# Patient Record
Sex: Female | Born: 1985 | Race: White | Hispanic: No | Marital: Single | Smoking: Never smoker
Health system: Southern US, Community
[De-identification: ages and names within clinical notes are randomized; demographics above are authoritative.]

## PROBLEM LIST (undated history)

## (undated) DIAGNOSIS — F419 Anxiety disorder, unspecified: Secondary | ICD-10-CM

## (undated) HISTORY — PX: NO PAST SURGERIES: SHX2092

---

## 2007-09-10 ENCOUNTER — Ambulatory Visit: Payer: Self-pay | Admitting: Internal Medicine

## 2010-05-17 ENCOUNTER — Emergency Department: Payer: Self-pay

## 2014-01-06 ENCOUNTER — Emergency Department: Payer: Self-pay | Admitting: Emergency Medicine

## 2017-10-23 ENCOUNTER — Encounter: Payer: Self-pay | Admitting: Emergency Medicine

## 2017-10-23 ENCOUNTER — Emergency Department
Admission: EM | Admit: 2017-10-23 | Discharge: 2017-10-23 | Disposition: A | Discharge status: Home / Self Care | Payer: Self-pay | Attending: Emergency Medicine | Admitting: Emergency Medicine

## 2017-10-23 ENCOUNTER — Other Ambulatory Visit: Payer: Self-pay

## 2017-10-23 ENCOUNTER — Emergency Department: Payer: Self-pay

## 2017-10-23 DIAGNOSIS — M5442 Lumbago with sciatica, left side: Secondary | ICD-10-CM | POA: Insufficient documentation

## 2017-10-23 LAB — COMPREHENSIVE METABOLIC PANEL
ALBUMIN: 4 g/dL (ref 3.5–5.0)
ALK PHOS: 52 U/L (ref 38–126)
ALT: 10 U/L — ABNORMAL LOW (ref 14–54)
ANION GAP: 11 (ref 5–15)
AST: 19 U/L (ref 15–41)
BUN: 15 mg/dL (ref 6–20)
CALCIUM: 9 mg/dL (ref 8.9–10.3)
CO2: 23 mmol/L (ref 22–32)
Chloride: 105 mmol/L (ref 101–111)
Creatinine, Ser: 0.69 mg/dL (ref 0.44–1.00)
GFR calc Af Amer: >60 mL/min (ref >60)
GFR calc non Af Amer: >60 mL/min (ref >60)
GLUCOSE: 102 mg/dL — AB (ref 65–99)
Potassium: 3.5 mmol/L (ref 3.5–5.1)
SODIUM: 139 mmol/L (ref 135–145)
Total Bilirubin: 0.4 mg/dL (ref 0.3–1.2)
Total Protein: 7.6 g/dL (ref 6.5–8.1)

## 2017-10-23 LAB — DIFFERENTIAL
BASOS ABS: 0 10*3/uL (ref 0–0.1)
Basophils Relative: 0 %
EOS ABS: 0.1 10*3/uL (ref 0–0.7)
Eosinophils Relative: 2 %
LYMPHS ABS: 1.6 10*3/uL (ref 1.0–3.6)
LYMPHS PCT: 28 %
Monocytes Absolute: 0.4 10*3/uL (ref 0.2–0.9)
Monocytes Relative: 8 %
NEUTROS PCT: 62 %
Neutro Abs: 3.5 10*3/uL (ref 1.4–6.5)

## 2017-10-23 LAB — CBC
HCT: 38.6 % (ref 35.0–47.0)
HEMOGLOBIN: 13 g/dL (ref 12.0–16.0)
MCH: 32 pg (ref 26.0–34.0)
MCHC: 33.7 g/dL (ref 32.0–36.0)
MCV: 94.7 fL (ref 80.0–100.0)
Platelets: 367 10*3/uL (ref 150–440)
RBC: 4.08 MIL/uL (ref 3.80–5.20)
RDW: 16.1 % — ABNORMAL HIGH (ref 11.5–14.5)
WBC: 5.6 10*3/uL (ref 3.6–11.0)

## 2017-10-23 LAB — TROPONIN I: Troponin I: <0.03 ng/mL (ref <0.03)

## 2017-10-23 LAB — POCT PREGNANCY, URINE: Preg Test, Ur: NEGATIVE

## 2017-10-23 MED ORDER — PREDNISONE 20 MG PO TABS: 40.0000 mg | ORAL_TABLET | Freq: Every day | ORAL | 0 refills | Status: DC

## 2017-10-23 MED ORDER — KETOROLAC TROMETHAMINE 10 MG PO TABS: 10.0000 mg | ORAL_TABLET | Freq: Four times a day (QID) | ORAL | 0 refills | Status: DC | PRN

## 2017-10-23 NOTE — ED Notes (Signed)
Pt c/o bilateraly leg pain with weakness, states unable to ambulate due to weakness to legs. Pt in NAD at this time, states pain with movement. Denies injures, no deformity or swelling noted.

## 2017-10-23 NOTE — ED Triage Notes (Signed)
Pt presents with legs that are "seizing up" today. States she has cramping in her toes with numbness. Pt has had nerve pain from her neck for 2 weeks and today she is having difficulty with walking due to the pain. Pt alert & oriented with NAD noted.

## 2017-10-23 NOTE — ED Provider Notes (Signed)
[facility] Emergency Department Provider Note  Time seen: 4:56 PM  I have reviewed the triage vital signs and the nursing notes.   HISTORY  Chief Complaint Leg Pain    HPI Misty Rowe is a 31 y.o. female with no significant past medical history who presents to the emergency department for lower back pain and left leg pain.  According to the patient approximately 2 weeks ago at her job she was attempting to move heavy objects and she developed lower back pain.  He states over the past 2 weeks the back pain has progressively worsened to the point where she has pain just from standing upright at work.  She also states over the past 1 week she is now experiencing pain radiating all the way down her left leg and occasional tingling/numbness sensations throughout the left leg.  States pain is much worse with any movement of the leg or lower back.  Denies any fever.  No edema.  Patient denies any incontinence of bowel or bladder.  History reviewed. No pertinent past medical history.  There are no active problems to display for this patient.   History reviewed. No pertinent surgical history.  Prior to Admission medications   Not on File    No Known Allergies  No family history on file.  Social History Social History   Tobacco Use  . Smoking status: Never Smoker  . Smokeless tobacco: Never Used  Substance Use Topics  . Alcohol use: No    Frequency: Never  . Drug use: No    Review of Systems Constitutional: Negative for fever. Cardiovascular: Negative for chest pain. Respiratory: Negative for shortness of breath. Gastrointestinal: Negative for abdominal pain Musculoskeletal: Left leg pain.  Lower back pain Skin: Negative for rash. All other ROS negative  ____________________________________________   PHYSICAL EXAM:  VITAL SIGNS: ED Triage Vitals  Enc Vitals Group     BP 10/23/17 1557 116/67     Pulse Rate 10/23/17 1557 95     Resp  10/23/17 1557 20     Temp 10/23/17 1557 98.4 F (36.9 C)     Temp Source 10/23/17 1557 Oral     SpO2 10/23/17 1557 100 %     Weight 10/23/17 1558 30 lb (13.6 kg)     Height 10/23/17 1558 5\' 3"  (1.6 m)     Head Circumference --      Peak Flow --      Pain Score 10/23/17 1608 9     Pain Loc --      Pain Edu? --      Excl. in GC? --    Constitutional: Alert and oriented. Well appearing and in no distress. Eyes: Normal exam ENT   Head: Normocephalic and atraumatic.   Mouth/Throat: Mucous membranes are moist. Cardiovascular: Normal rate, regular rhythm. No murmur Respiratory: Normal respiratory effort without tachypnea nor retractions. Breath sounds are clear Gastrointestinal: Soft and nontender. No distention.   Musculoskeletal: Nontender with normal range of motion in all extremities.  No lower extremity edema.  No significant tenderness to palpation.  The patient points out numbness/tingling in her distal thigh Neurologic:  Normal speech and language.  Patient has 5/5 strength in bilateral lower extremities.  Patient states intact and equal sensation below the knee, states mild sensory deficit in the left lower extremity to the distal thigh but then states normal sensation to the proximal thigh bilaterally. Skin:  Skin is warm, dry and intact.  Psychiatric: Mood and affect are  normal.   ____________________________________________     RADIOLOGY  MRI is normal  ____________________________________________   INITIAL IMPRESSION / ASSESSMENT AND PLAN / ED COURSE  Pertinent labs & imaging results that were available during my care of the patient were reviewed by me and considered in my medical decision making (see chart for details).  Patient presented to the emergency department for worsening lower back pain now radiating to the left leg and occasional numbness/tingling sensations in the left leg.  Differential would include musculoskeletal pain, radiculopathy/sciatica,  less likely cauda equina.  Given the progressive worsening I discussed the options with the patient including MRI of the lower back to rule out spinal/nerve root compression.  Patient wishes to proceed with MRI of the lower back.  On physical exam patient has great strength, sensation is largely intact besides the distal thigh of the left lower extremity which she says numbness/tingling sensations.  Patient states at times she feels like the left leg is weak and she has difficulty standing on it.  I believe an MRI would be warranted to rule out significant nerve root compression or cauda equina syndrome.  No edema or significant length tenderness identified, no skin color changes, no clinical concern for DVT, symptoms seem much more suggestive of sciatica/radiculopathy.  ----------------------------------------- 8:09 PM on 10/23/2017 ----------------------------------------- We continue to await MRI, they state the patient is next but they are currently finishing up a case.  Patient's MRI has resulted and is normal.  No canal stenosis or significant abnormality identified.  We will place the patient on a course of prednisone as well as Toradol for pain control and have the patient follow-up with her primary care doctor. ____________________________________________   FINAL CLINICAL IMPRESSION(S) / ED DIAGNOSES  Lower back pain Sciatica    Minna AntisPaduchowski, Haydn Cush, MD 10/23/17 2124

## 2017-10-28 ENCOUNTER — Other Ambulatory Visit: Payer: Self-pay

## 2017-10-28 ENCOUNTER — Encounter: Payer: Self-pay | Admitting: Emergency Medicine

## 2017-10-28 ENCOUNTER — Emergency Department
Admission: EM | Admit: 2017-10-28 | Discharge: 2017-10-28 | Disposition: A | Discharge status: Home / Self Care | Payer: Self-pay | Attending: Emergency Medicine | Admitting: Emergency Medicine

## 2017-10-28 ENCOUNTER — Emergency Department: Payer: Self-pay

## 2017-10-28 DIAGNOSIS — R4781 Slurred speech: Secondary | ICD-10-CM | POA: Insufficient documentation

## 2017-10-28 DIAGNOSIS — R29810 Facial weakness: Secondary | ICD-10-CM | POA: Insufficient documentation

## 2017-10-28 DIAGNOSIS — R2681 Unsteadiness on feet: Secondary | ICD-10-CM | POA: Insufficient documentation

## 2017-10-28 DIAGNOSIS — M545 Low back pain: Secondary | ICD-10-CM | POA: Insufficient documentation

## 2017-10-28 LAB — CBC
HEMATOCRIT: 38.4 % (ref 35.0–47.0)
HEMOGLOBIN: 13 g/dL (ref 12.0–16.0)
MCH: 31.9 pg (ref 26.0–34.0)
MCHC: 33.8 g/dL (ref 32.0–36.0)
MCV: 94.4 fL (ref 80.0–100.0)
Platelets: 343 10*3/uL (ref 150–440)
RBC: 4.07 MIL/uL (ref 3.80–5.20)
RDW: 15.9 % — ABNORMAL HIGH (ref 11.5–14.5)
WBC: 10.7 10*3/uL (ref 3.6–11.0)

## 2017-10-28 LAB — COMPREHENSIVE METABOLIC PANEL
ALT: 12 U/L — AB (ref 14–54)
AST: 14 U/L — ABNORMAL LOW (ref 15–41)
Albumin: 4.2 g/dL (ref 3.5–5.0)
Alkaline Phosphatase: 50 U/L (ref 38–126)
Anion gap: 10 (ref 5–15)
BILIRUBIN TOTAL: 0.2 mg/dL — AB (ref 0.3–1.2)
BUN: 18 mg/dL (ref 6–20)
CO2: 24 mmol/L (ref 22–32)
CREATININE: 0.53 mg/dL (ref 0.44–1.00)
Calcium: 9.1 mg/dL (ref 8.9–10.3)
Chloride: 106 mmol/L (ref 101–111)
GFR calc Af Amer: >60 mL/min (ref >60)
Glucose, Bld: 91 mg/dL (ref 65–99)
Potassium: 3.4 mmol/L — ABNORMAL LOW (ref 3.5–5.1)
Sodium: 140 mmol/L (ref 135–145)
TOTAL PROTEIN: 7.7 g/dL (ref 6.5–8.1)

## 2017-10-28 LAB — DIFFERENTIAL
BASOS ABS: 0.1 10*3/uL (ref 0–0.1)
Basophils Relative: 1 %
EOS ABS: 0.1 10*3/uL (ref 0–0.7)
Eosinophils Relative: 1 %
LYMPHS ABS: 3.2 10*3/uL (ref 1.0–3.6)
Lymphocytes Relative: 30 %
MONOS PCT: 12 %
Monocytes Absolute: 1.2 10*3/uL — ABNORMAL HIGH (ref 0.2–0.9)
NEUTROS ABS: 6.2 10*3/uL (ref 1.4–6.5)
Neutrophils Relative %: 58 %

## 2017-10-28 LAB — TROPONIN I

## 2017-10-28 MED ORDER — GADOBENATE DIMEGLUMINE 529 MG/ML IV SOLN
12.0000 mL | Freq: Once | INTRAVENOUS | Status: AC | PRN
  Administered 2017-10-28: 12 mL via INTRAVENOUS

## 2017-10-28 NOTE — ED Notes (Signed)
Pt to mri 

## 2017-10-28 NOTE — ED Notes (Signed)
Pt back from mri

## 2017-10-28 NOTE — ED Notes (Signed)
Mri spoke with pt - pt changed into gown and all jewelry and metal removed. Pt given blanket.

## 2017-10-28 NOTE — ED Notes (Signed)
Pt still in mri 

## 2017-10-28 NOTE — ED Triage Notes (Signed)
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 She felt a pop in her back was unable to walk. She came here and was given three days off, today she was at work and and co workers told her that she was studdering and slurring her words. She reports that she has to work 11 hours tomorrow and she feels that she is having trouble walking and wont be able to do this. She states that she feels like she looks like she is drunk and customers are looking at her like she is drunk. She did bring workers comp papers again.

## 2017-10-28 NOTE — Discharge Instructions (Signed)
Please seek medical attention for any high fevers, chest pain, shortness of breath, change in behavior, persistent vomiting, bloody stool or any other new or concerning symptoms.  

## 2017-10-28 NOTE — ED Provider Notes (Signed)
[facility] Emergency Department Provider Note  ____________________________________________   I have reviewed the triage vital signs and the nursing notes.   HISTORY  Chief Complaint Gait Problem   History limited by: Not Limited   HPI Misty Rowe is a 31 y.o. female who presents to the emergency department today because of concern for concern for stroke with neurologic deficits.    LOCATION:facial asymetry, gait instability, left upper extremity weakness, slurred speech DURATION:gait instability for a couple of weeks, others symptoms started today TIMING: constant SEVERITY: severe QUALITY: weakness, slurred speech, inbalance CONTEXT: patient was seen in the emergency department roughly 5 days ago for gait issues after a fall. Patient had lumbar spine mri done at that time without any concerning findings.  MODIFYING FACTORS: none ASSOCIATED SYMPTOMS: denies headache. Denies head trauma  Per medical record review patient has a history of recent er visit with lumbar spine mri.   History reviewed. No pertinent past medical history.  There are no active problems to display for this patient.   History reviewed. No pertinent surgical history.  Prior to Admission medications   Medication Sig Start Date End Date Taking? Authorizing Provider  ketorolac (TORADOL) 10 MG tablet Take 1 tablet (10 mg total) every 6 (six) hours as needed by mouth. 10/23/17   Harvest Dark, MD  predniSONE (DELTASONE) 20 MG tablet Take 2 tablets (40 mg total) daily by mouth. 10/23/17   Harvest Dark, MD    Allergies Pertussis vaccines  No family history on file.  Social History Social History   Tobacco Use  . Smoking status: Never Smoker  . Smokeless tobacco: Never Used  Substance Use Topics  . Alcohol use: No    Frequency: Never  . Drug use: No    Review of Systems Constitutional: No fever/chills Eyes: No visual changes. ENT: No sore  throat. Cardiovascular: Denies chest pain. Respiratory: Denies shortness of breath. Gastrointestinal: No abdominal pain.  No nausea, no vomiting.  No diarrhea.   Genitourinary: Negative for dysuria. Musculoskeletal: Positive for low back pain. Skin: Negative for rash. Neurological: Positive for slurred speech, positive for facial droop, positive for imbalance.   ____________________________________________   PHYSICAL EXAM:  VITAL SIGNS: ED Triage Vitals  Enc Vitals Group     BP 10/28/17 1452 114/60     Pulse Rate 10/28/17 1452 87     Resp 10/28/17 1452 18     Temp 10/28/17 1452 98.1 F (36.7 C)     Temp Source 10/28/17 1452 Oral     SpO2 10/28/17 1452 99 %     Weight 10/28/17 1454 132 lb (59.9 kg)     Height 10/28/17 1454 5' 3" (1.6 m)     Head Circumference --      Peak Flow --      Pain Score 10/28/17 1451 8    Constitutional: Alert and oriented. Well appearing and in no distress. Eyes: Conjunctivae are normal.  ENT   Head: Normocephalic and atraumatic.   Nose: No congestion/rhinnorhea.   Mouth/Throat: Mucous membranes are moist.   Neck: No stridor. Hematological/Lymphatic/Immunilogical: No cervical lymphadenopathy. Cardiovascular: Normal rate, regular rhythm.  No murmurs, rubs, or gallops.  Respiratory: Normal respiratory effort without tachypnea nor retractions. Breath sounds are clear and equal bilaterally. No wheezes/rales/rhonchi. Gastrointestinal: Soft and non tender. No rebound. No guarding.  Genitourinary: Deferred Musculoskeletal: Normal range of motion in all extremities. No lower extremity edema. Neurologic:  Normal speech and language. No gross focal neurologic deficits are appreciated.  Skin:  Skin is warm, dry and intact. No rash noted. Psychiatric: Mood and affect are normal. Speech and behavior are normal. Patient exhibits appropriate insight and judgment.  ____________________________________________    LABS (pertinent  positives/negatives)  Cbc wnl except rdw CMP k 3.4 Trop <0.03  ____________________________________________   EKG  None  ____________________________________________    RADIOLOGY  CT head No acute process  MRI Brain Normal brain ____________________________________________   PROCEDURES  Procedures  ____________________________________________   INITIAL IMPRESSION / ASSESSMENT AND PLAN / ED COURSE  Pertinent labs & imaging results that were available during my care of the patient were reviewed by me and considered in my medical decision making (see chart for details).  Patient presented to the emergency department today with multiple neurologic complaints. There was history of fall with subsequent back pain and gait instability with ED evaluation and negative lumbar MRI. Today however symptoms now include slurred speech and facial droop. While neither were appreciated by myself on exam family did endorse those symptoms. Given myriad neurologic complaints including facial complaints MR brain was obtained to evaluate for CVA/MS or other intracranial process. This was negative. Blood work additionally without any obvious etiology of the patient's symptoms. At this time do feel it is safe for patient to be discharged from the emergency department. Will give patient follow up information for neurology. Discussed results and plan with patient and family.  ____________________________________________   FINAL CLINICAL IMPRESSION(S) / ED DIAGNOSES  Final diagnoses:  Gait instability  Slurred speech  Facial droop  Low back pain, unspecified back pain laterality, unspecified chronicity, with sciatica presence unspecified     Note: This dictation was prepared with Dragon dictation. Any transcriptional errors that result from this process are unintentional     Nance Pear, MD 10/28/17 2238

## 2017-11-20 ENCOUNTER — Other Ambulatory Visit: Payer: Self-pay | Admitting: Neurology

## 2017-11-20 DIAGNOSIS — G629 Polyneuropathy, unspecified: Secondary | ICD-10-CM

## 2017-11-20 DIAGNOSIS — R531 Weakness: Secondary | ICD-10-CM

## 2017-11-20 DIAGNOSIS — R270 Ataxia, unspecified: Secondary | ICD-10-CM

## 2017-11-30 ENCOUNTER — Ambulatory Visit
Admission: RE | Admit: 2017-11-30 | Discharge: 2017-11-30 | Disposition: A | Discharge status: Home / Self Care | Payer: No Typology Code available for payment source | Source: Ambulatory Visit | Attending: Neurology | Admitting: Neurology

## 2017-11-30 ENCOUNTER — Other Ambulatory Visit: Payer: Self-pay | Admitting: Neurology

## 2017-11-30 DIAGNOSIS — R531 Weakness: Secondary | ICD-10-CM

## 2017-11-30 DIAGNOSIS — R270 Ataxia, unspecified: Secondary | ICD-10-CM | POA: Diagnosis not present

## 2017-11-30 DIAGNOSIS — G629 Polyneuropathy, unspecified: Secondary | ICD-10-CM | POA: Diagnosis not present

## 2017-11-30 DIAGNOSIS — M4802 Spinal stenosis, cervical region: Secondary | ICD-10-CM | POA: Diagnosis not present

## 2017-11-30 DIAGNOSIS — M50223 Other cervical disc displacement at C6-C7 level: Secondary | ICD-10-CM | POA: Diagnosis not present

## 2018-01-29 ENCOUNTER — Ambulatory Visit
Admission: RE | Admit: 2018-01-29 | Discharge: 2018-01-29 | Disposition: A | Discharge status: Home / Self Care | Payer: Worker's Compensation | Source: Ambulatory Visit | Attending: Physician Assistant | Admitting: Physician Assistant

## 2018-01-29 ENCOUNTER — Other Ambulatory Visit: Payer: Self-pay | Admitting: Physician Assistant

## 2018-01-29 DIAGNOSIS — R9389 Abnormal findings on diagnostic imaging of other specified body structures: Secondary | ICD-10-CM | POA: Insufficient documentation

## 2018-01-29 DIAGNOSIS — M546 Pain in thoracic spine: Secondary | ICD-10-CM | POA: Insufficient documentation

## 2018-01-29 DIAGNOSIS — M549 Dorsalgia, unspecified: Secondary | ICD-10-CM

## 2018-04-19 IMAGING — MR MR CERVICAL SPINE W/O CM
5 series · 35 of 48 positions shown · non-contrast
Comparison: None.

CLINICAL DATA: Low back pain and weakness. Tension across the
scapulae. Ataxia, imbalance, and multiple falls. Symptoms for 2
months.

EXAM:
MRI CERVICAL SPINE WITHOUT CONTRAST
TECHNIQUE: Multiplanar, multisequence MR imaging of the cervical spine was
performed. No intravenous contrast was administered.

[Series 3: T2 · sagittal · 3.0mm · 0.70mm/px · 8 of 13 slices shown (1 of 2)]
[im 1/13]
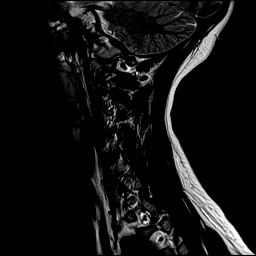
[im 2/13]
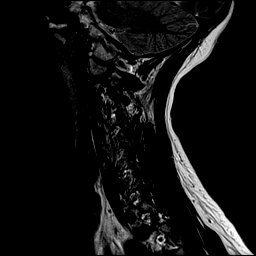
[im 4/13]
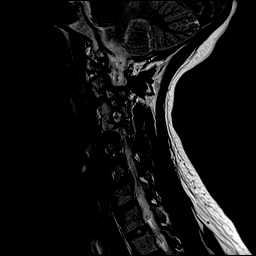
[im 6/13]
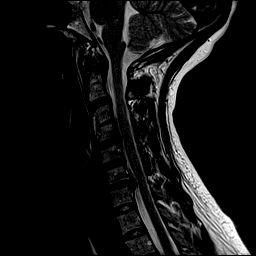
[im 7/13]
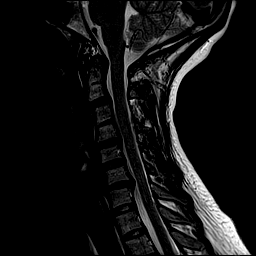
[im 9/13]
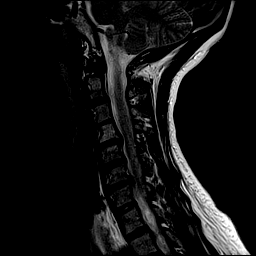
[im 11/13]
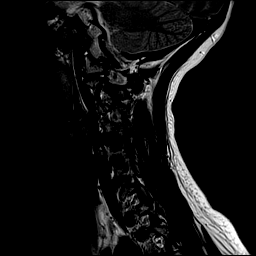
[im 13/13]
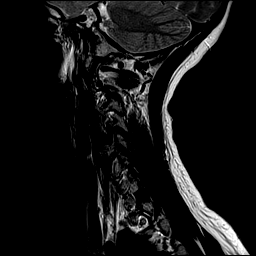

[Series 4: T1 · sagittal · 3.0mm · 0.70mm/px · 7 of 13 slices shown]
[im 1/13]
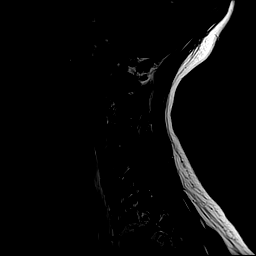
[im 3/13]
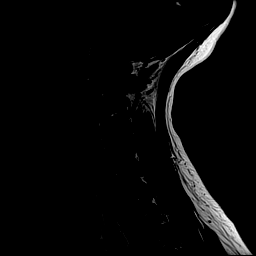
[im 5/13]
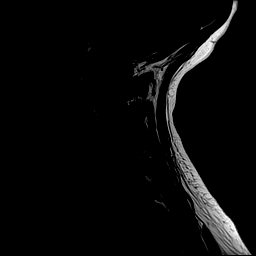
[im 7/13]
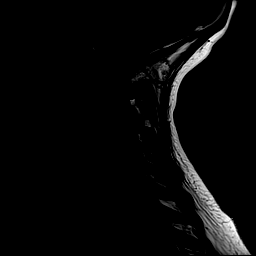
[im 9/13]
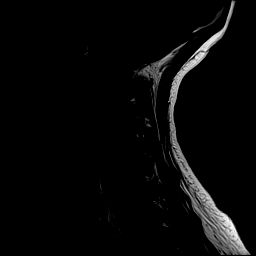
[im 11/13]
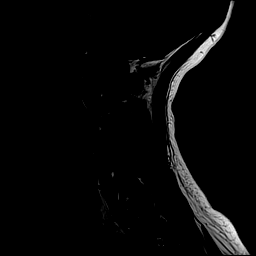
[im 13/13]
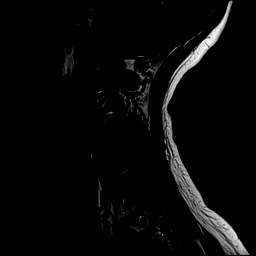

[Series 5: STIR · sagittal · 3.0mm · 0.35mm/px · 7 of 13 slices shown]
[im 1/13]
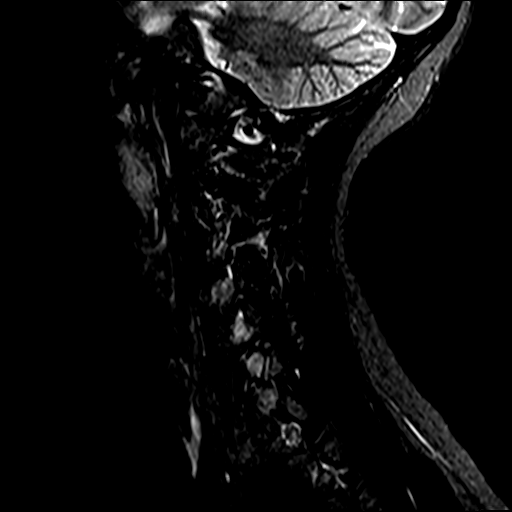
[im 3/13]
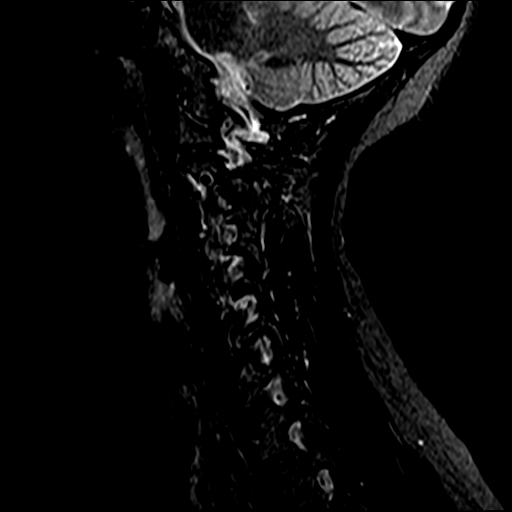
[im 5/13]
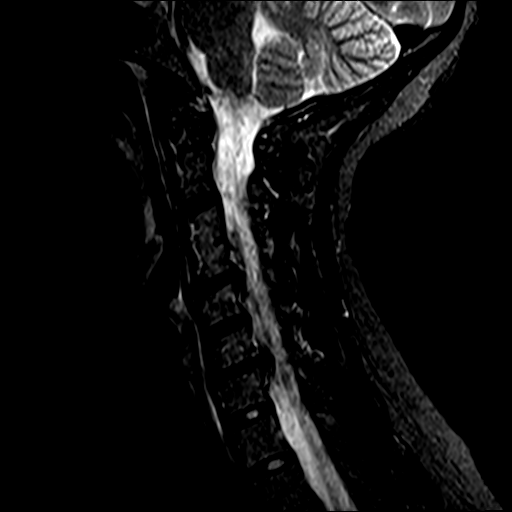
[im 7/13]
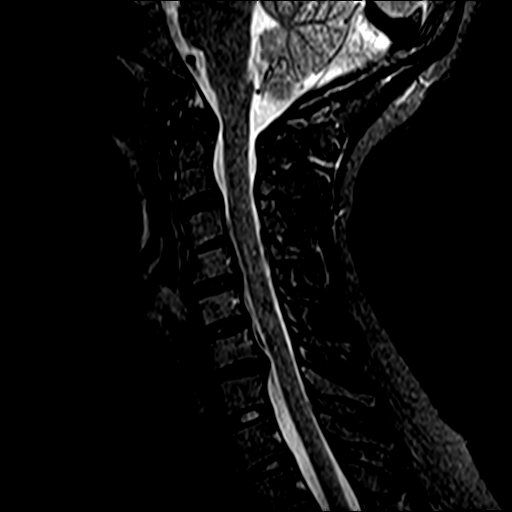
[im 9/13]
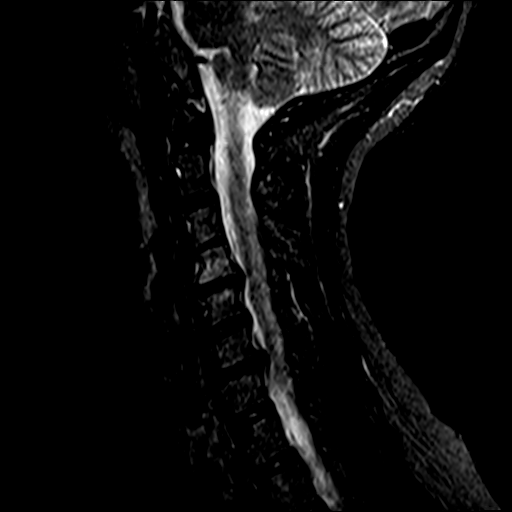
[im 11/13]
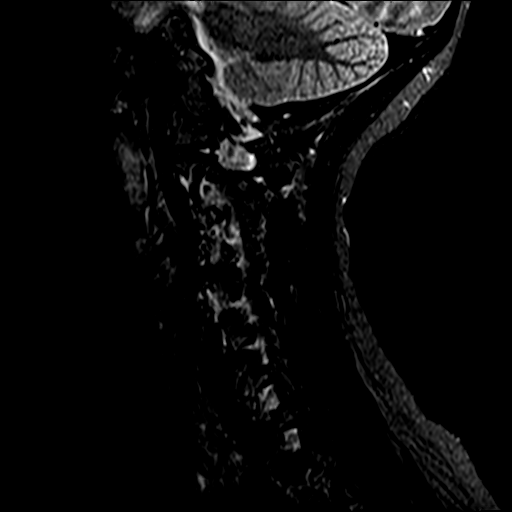
[im 13/13]
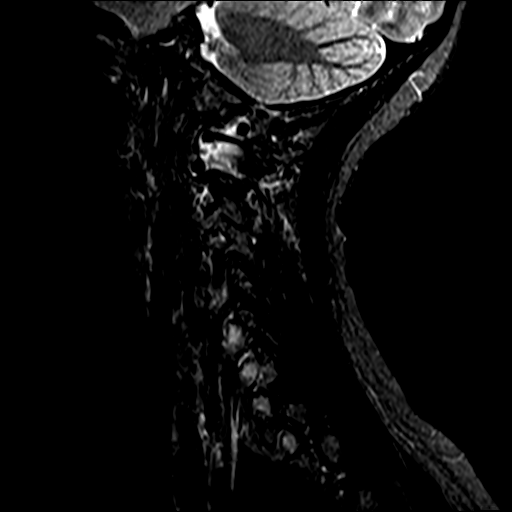

[Series 6: T2 · axial · 3.0mm · 0.70mm/px · z∈[-94,-3]mm · 9 of 25 slices shown (2 of 2)]
[im 1/25]
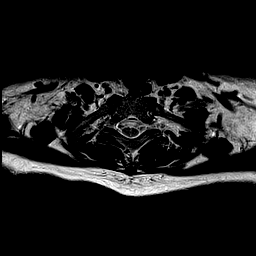
[im 5/25]
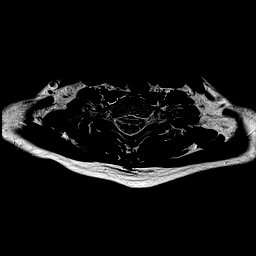
[im 9/25]
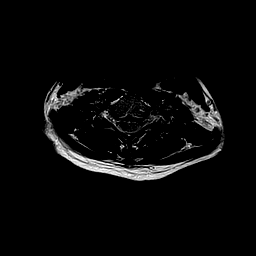
[im 11/25]
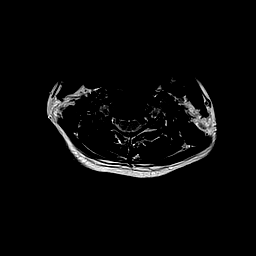
[im 13/25]
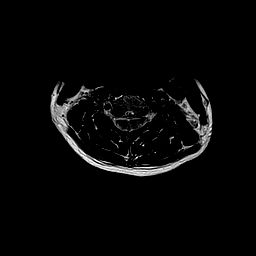
[im 15/25]
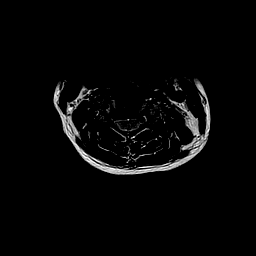
[im 17/25]
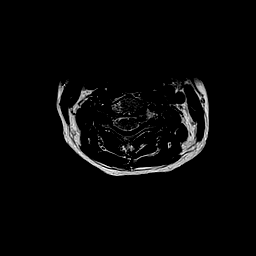
[im 21/25]
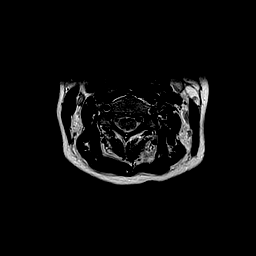
[im 25/25]
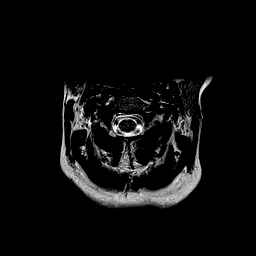

[Series 7: mpgr ax · axial · 3.0mm · 0.35mm/px · z∈[-94,-56]mm · 4 of 25 slices shown]
[im 1/25]
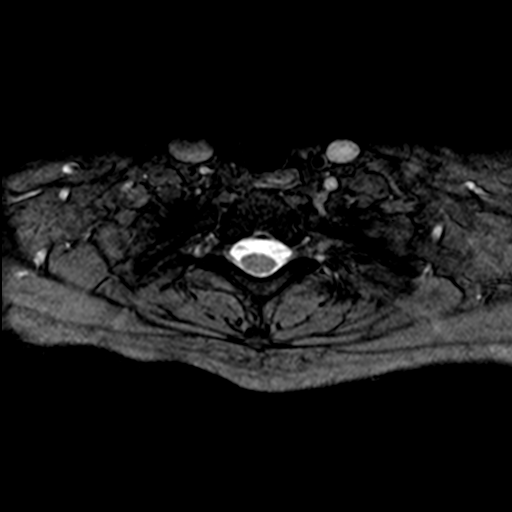
[im 5/25]
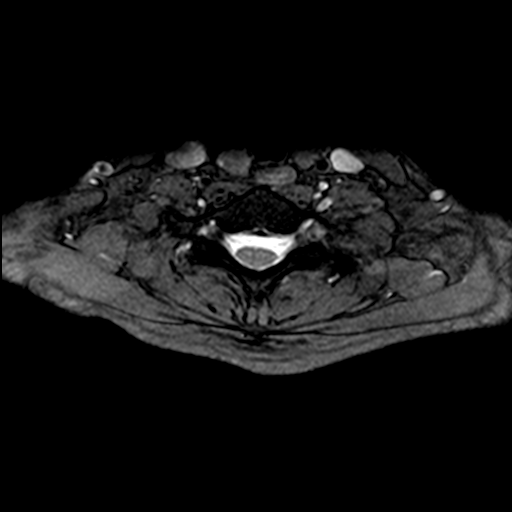
[im 9/25]
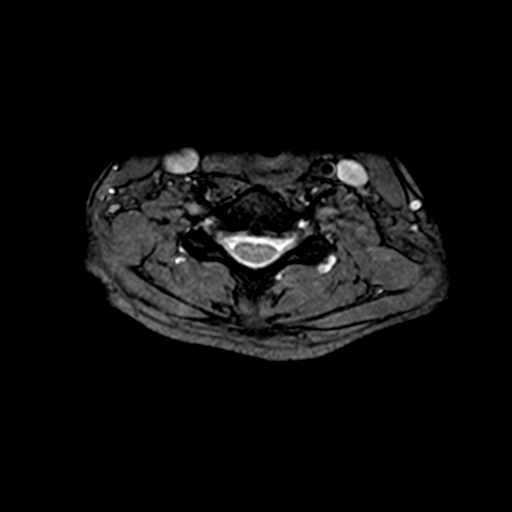
[im 11/25]
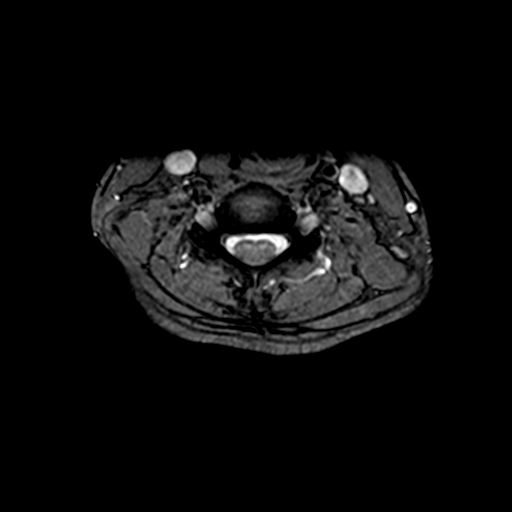

[35 of 48 positions shown; findings below may reference images not displayed]

FINDINGS: Alignment: Cervical spine straightening.  No listhesis.

Vertebrae: Congenital fusion of the C3 and C4 vertebral bodies.
Right-sided degenerative endplate edema at C4-5. No fracture or
suspicious osseous lesion.

Cord: Normal signal and morphology.

Posterior Fossa, vertebral arteries, paraspinal tissues:
Unremarkable.

Disc levels:

C2-3:  Mild left facet arthrosis without stenosis.

C3-4:  Congenital fusion.  No stenosis.

C4-5: Disc bulging disc bulging and right greater than left
uncovertebral spurring result in moderate to severe right neural
foraminal stenosis with potential C5 nerve root impingement and mild
spinal stenosis.

C5-6: Mild disc bulging and uncovertebral spurring result in mild
right neural foraminal stenosis without spinal stenosis.

C6-7: Mild disc space narrowing. Broad left central disc protrusion
results in mild spinal stenosis. Uncovertebral spurring results in
mild right and minimal left neural foraminal stenosis.

C7-T1:  Negative.
IMPRESSION: 1. Broad C6-7 disc protrusion resulting in mild spinal stenosis.
2. Mild spinal stenosis and moderate to severe right neural
foraminal stenosis at C4-5.

## 2018-09-24 ENCOUNTER — Ambulatory Visit (INDEPENDENT_AMBULATORY_CARE_PROVIDER_SITE_OTHER): Payer: BLUE CROSS/BLUE SHIELD

## 2018-09-24 ENCOUNTER — Ambulatory Visit
Admission: EM | Admit: 2018-09-24 | Discharge: 2018-09-24 | Disposition: A | Discharge status: Home / Self Care | Payer: BLUE CROSS/BLUE SHIELD | Attending: Emergency Medicine | Admitting: Emergency Medicine

## 2018-09-24 ENCOUNTER — Other Ambulatory Visit: Payer: Self-pay

## 2018-09-24 DIAGNOSIS — R05 Cough: Secondary | ICD-10-CM

## 2018-09-24 DIAGNOSIS — R1111 Vomiting without nausea: Secondary | ICD-10-CM

## 2018-09-24 DIAGNOSIS — R0602 Shortness of breath: Secondary | ICD-10-CM | POA: Diagnosis not present

## 2018-09-24 DIAGNOSIS — R059 Cough, unspecified: Secondary | ICD-10-CM

## 2018-09-24 LAB — CBC WITH DIFFERENTIAL/PLATELET
Abs Immature Granulocytes: 0.07 10*3/uL (ref 0.00–0.07)
BASOS ABS: 0 10*3/uL (ref 0.0–0.1)
Basophils Relative: 0 %
EOS PCT: 3 %
Eosinophils Absolute: 0.3 10*3/uL (ref 0.0–0.5)
HEMATOCRIT: 40.5 % (ref 36.0–46.0)
Hemoglobin: 13.1 g/dL (ref 12.0–15.0)
Immature Granulocytes: 1 %
Lymphocytes Relative: 16 %
Lymphs Abs: 2 10*3/uL (ref 0.7–4.0)
MCH: 29.4 pg (ref 26.0–34.0)
MCHC: 32.3 g/dL (ref 30.0–36.0)
MCV: 90.8 fL (ref 80.0–100.0)
Monocytes Absolute: 1.2 10*3/uL — ABNORMAL HIGH (ref 0.1–1.0)
Monocytes Relative: 10 %
NRBC: 0 % (ref 0.0–0.2)
Neutro Abs: 8.8 10*3/uL — ABNORMAL HIGH (ref 1.7–7.7)
Neutrophils Relative %: 70 %
Platelets: 293 10*3/uL (ref 150–400)
RBC: 4.46 MIL/uL (ref 3.87–5.11)
RDW: 13.2 % (ref 11.5–15.5)
WBC: 12.4 10*3/uL — ABNORMAL HIGH (ref 4.0–10.5)

## 2018-09-24 LAB — COMPREHENSIVE METABOLIC PANEL
ALK PHOS: 65 U/L (ref 38–126)
ALT: 11 U/L (ref 0–44)
ANION GAP: 11 (ref 5–15)
AST: 16 U/L (ref 15–41)
Albumin: 4 g/dL (ref 3.5–5.0)
BUN: 10 mg/dL (ref 6–20)
CALCIUM: 9 mg/dL (ref 8.9–10.3)
CO2: 26 mmol/L (ref 22–32)
Chloride: 105 mmol/L (ref 98–111)
Creatinine, Ser: 0.59 mg/dL (ref 0.44–1.00)
GFR calc Af Amer: >60 mL/min (ref >60)
GFR calc non Af Amer: >60 mL/min (ref >60)
Glucose, Bld: 110 mg/dL — ABNORMAL HIGH (ref 70–99)
Potassium: 3.8 mmol/L (ref 3.5–5.1)
SODIUM: 142 mmol/L (ref 135–145)
TOTAL PROTEIN: 8.2 g/dL — AB (ref 6.5–8.1)
Total Bilirubin: 0.6 mg/dL (ref 0.3–1.2)

## 2018-09-24 LAB — LIPASE, BLOOD: Lipase: 22 U/L (ref 11–51)

## 2018-09-24 MED ORDER — HYDROCOD POLST-CPM POLST ER 10-8 MG/5ML PO SUER: 5.0000 mL | Freq: Two times a day (BID) | ORAL | 0 refills | Status: DC | PRN

## 2018-09-24 MED ORDER — SODIUM CHLORIDE 0.9 % IV SOLN
Freq: Once | INTRAVENOUS | Status: AC
  Administered 2018-09-24: 11:00:00 via INTRAVENOUS

## 2018-09-24 MED ORDER — PANTOPRAZOLE SODIUM 20 MG PO TBEC: 20.0000 mg | DELAYED_RELEASE_TABLET | Freq: Every day | ORAL | 0 refills | Status: DC

## 2018-09-24 MED ORDER — GI COCKTAIL ~~LOC~~
30.0000 mL | Freq: Once | ORAL | Status: AC
  Administered 2018-09-24: 30 mL via ORAL

## 2018-09-24 MED ORDER — ONDANSETRON HCL 4 MG/2ML IJ SOLN
8.0000 mg | Freq: Once | INTRAMUSCULAR | Status: AC
  Administered 2018-09-24: 8 mg via INTRAMUSCULAR

## 2018-09-24 MED ORDER — ONDANSETRON 8 MG PO TBDP: 8.0000 mg | ORAL_TABLET | Freq: Three times a day (TID) | ORAL | 0 refills | Status: DC | PRN

## 2018-09-24 MED ORDER — FLUTICASONE PROPIONATE 50 MCG/ACT NA SUSP: 2.0000 | Freq: Every day | NASAL | 0 refills | Status: DC

## 2018-09-24 MED ORDER — BENZONATATE 200 MG PO CAPS: 200.0000 mg | ORAL_CAPSULE | Freq: Three times a day (TID) | ORAL | 0 refills | Status: DC | PRN

## 2018-09-24 NOTE — ED Triage Notes (Signed)
Patient complains of cough, congestion, sinus pain and pressure, nausea and vomiting. Patient states that she has been unable to keep food down for 1 week. She states that this is due to the mucus.

## 2018-09-24 NOTE — Discharge Instructions (Signed)
Try some Allegra-D, saline nasal irrigation with a Lloyd Huger med rinse and distilled water, Flonase to help with the nasal congestion, postnasal drip.  Tussionex for the cough at night, Tessalon for the cough during the day.  Zofran as needed for nausea and vomiting.  Protonix will help with your acid reflux, you may also try an H2 blocker such as Pepcid.  Start with liquid diet, push electrolyte containing fluids such as Pedialyte or Gatorade, and advance as tolerated.  Follow-up with your doctor in several days if you are not getting any better, go to the ER for the signs and symptoms we discussed.

## 2018-09-24 NOTE — ED Provider Notes (Signed)
HPI  SUBJECTIVE:  Misty Rowe is a 32 y.o. female who presents with a cough, nasal congestion, rhinorrhea, postnasal drip, wheezing, shortness of breath, burning chest pain and chest soreness secondary to cough for the past week.  She states that she cannot sleep secondary to the cough.  Reports right-sided headache from the coughing.  She also reports over 10 episodes a day of nonbilious, nonbloody emesis that she thinks is from the drainage.  Denies nausea. Reports some posttussive emesis but also vomiting separate from the coughing. She reports worsening of her umbilical hernia with bulging and sharp pain with coughing.  The abdominal pain is not present at any other time.  She states that she has not had a bowel movement in several days, but she has also been able to tolerate any p.o. intake, including liquids for the past week.  She states that she is passing gas.  She denies diarrhea, abdominal distention, change in urine output.  No fevers during this time.  No dyspnea on exertion.  She has been trying to eat applesauce, Jell-O, push electrolyte containing fluids and water, but states that "all this comes up".  She has also tried Mucinex, elderberry, allergy meds for the cough and nasal congestion.  No alleviating factors.  Her symptoms are worse with coughing.  She has a past medical history of GERD, umbilical hernia, sinusitis, back injury, asthma as a child.  No history of cancer, abdominal obstruction, pancreatitis, diabetes, hypertension, abdominal surgeries, smoking, alcohol, drug use.  LMP: 9/23 denies the possibility of being pregnant.  PMD: [facility] in [place].  History reviewed. No pertinent past medical history.  Past Surgical History:  Procedure Laterality Date  . NO PAST SURGERIES      Family History  Problem Relation Age of Onset  . Asthma Mother   . Hypertension Mother     Social History   Tobacco Use  . Smoking status: Never Smoker  . Smokeless  tobacco: Never Used  Substance Use Topics  . Alcohol use: No    Frequency: Never  . Drug use: No    No current facility-administered medications for this encounter.   Current Outpatient Medications:  .  benzonatate (TESSALON) 200 MG capsule, Take 1 capsule (200 mg total) by mouth 3 (three) times daily as needed for cough., Disp: 30 capsule, Rfl: 0 .  chlorpheniramine-HYDROcodone (TUSSIONEX PENNKINETIC ER) 10-8 MG/5ML SUER, Take 5 mLs by mouth every 12 (twelve) hours as needed for cough., Disp: 60 mL, Rfl: 0 .  fluticasone (FLONASE) 50 MCG/ACT nasal spray, Place 2 sprays into both nostrils daily., Disp: 16 g, Rfl: 0 .  ondansetron (ZOFRAN-ODT) 8 MG disintegrating tablet, Take 1 tablet (8 mg total) by mouth every 8 (eight) hours as needed for nausea or vomiting., Disp: 20 tablet, Rfl: 0 .  pantoprazole (PROTONIX) 20 MG tablet, Take 1 tablet (20 mg total) by mouth daily., Disp: 30 tablet, Rfl: 0  Allergies  Allergen Reactions  . Pertussis Vaccines      ROS  As noted in HPI.   Physical Exam  BP 112/80 (BP Location: Left Arm)   Pulse 89   Temp 98.4 F (36.9 C) (Oral)   Resp 16   Ht 5\' 3"  (1.6 m)   Wt 68 kg   LMP 08/27/2018   SpO2 100%   BMI 26.57 kg/m   Constitutional: Well developed, well nourished, no acute distress Eyes: PERRL, EOMI, conjunctiva normal bilaterally HENT: Normocephalic, atraumatic,mucus membranes moist.  No nasal congestion.  Normal turbinates.  No sinus tenderness.  Normal oropharynx.  No postnasal drip or cobblestoning. Respiratory: Clear to auscultation bilaterally, no rales, no wheezing, no rhonchi Cardiovascular: regular tachycardia, no murmurs, no gallops, no rubs.  Cap refill  2 seconds. GI: Normal appearance, soft, nondistended, normal bowel sounds, positive mildly tender reducible umbilical hernia, no rebound, no guarding.  Negative Murphy, negative McBurney skin: No rash, skin intact Musculoskeletal: No edema, no tenderness, no  deformities Neurologic: Alert & oriented x 3, CN II-XII grossly intact, no motor deficits, sensation grossly intact Psychiatric: Speech and behavior appropriate   ED Course   Medications  0.9 %  sodium chloride infusion ( Intravenous New Bag/Given 09/24/18 1038)  ondansetron (ZOFRAN) injection 8 mg (8 mg Intramuscular Given 09/24/18 1025)  gi cocktail (Maalox,Lidocaine,Donnatal) (30 mLs Oral Given 09/24/18 1055)    Orders Placed This Encounter  Procedures  . DG Chest 2 View    Standing Status:   Standing    Number of Occurrences:   1    Order Specific Question:   Reason for Exam (SYMPTOM  OR DIAGNOSIS REQUIRED)    Answer:   cough r/o PNA  . Comprehensive metabolic panel    Standing Status:   Standing    Number of Occurrences:   1  . Lipase, blood    Standing Status:   Standing    Number of Occurrences:   1  . CBC with Differential    Standing Status:   Standing    Number of Occurrences:   1  . Recheck vitals    After IVF bolusd    Standing Status:   Standing    Number of Occurrences:   1  . Offer Fluids    Standing Status:   Standing    Number of Occurrences:   20  . Insert peripheral IV    Standing Status:   Standing    Number of Occurrences:   1   Results for orders placed or performed during the hospital encounter of 09/24/18 (from the past 24 hour(s))  Comprehensive metabolic panel     Status: Abnormal   Collection Time: 09/24/18 10:01 AM  Result Value Ref Range   Sodium 142 135 - 145 mmol/L   Potassium 3.8 3.5 - 5.1 mmol/L   Chloride 105 98 - 111 mmol/L   CO2 26 22 - 32 mmol/L   Glucose, Bld 110 (H) 70 - 99 mg/dL   BUN 10 6 - 20 mg/dL   Creatinine, Ser 1.61 0.44 - 1.00 mg/dL   Calcium 9.0 8.9 - 09.6 mg/dL   Total Protein 8.2 (H) 6.5 - 8.1 g/dL   Albumin 4.0 3.5 - 5.0 g/dL   AST 16 15 - 41 U/L   ALT 11 0 - 44 U/L   Alkaline Phosphatase 65 38 - 126 U/L   Total Bilirubin 0.6 0.3 - 1.2 mg/dL   GFR calc non Af Amer >60 >60 mL/min   GFR calc Af Amer >60 >60  mL/min   Anion gap 11 5 - 15  Lipase, blood     Status: None   Collection Time: 09/24/18 10:01 AM  Result Value Ref Range   Lipase 22 11 - 51 U/L  CBC with Differential     Status: Abnormal   Collection Time: 09/24/18 10:01 AM  Result Value Ref Range   WBC 12.4 (H) 4.0 - 10.5 K/uL   RBC 4.46 3.87 - 5.11 MIL/uL   Hemoglobin 13.1 12.0 - 15.0 g/dL   HCT 04.5  36.0 - 46.0 %   MCV 90.8 80.0 - 100.0 fL   MCH 29.4 26.0 - 34.0 pg   MCHC 32.3 30.0 - 36.0 g/dL   RDW 91.4 78.2 - 95.6 %   Platelets 293 150 - 400 K/uL   nRBC 0.0 0.0 - 0.2 %   Neutrophils Relative % 70 %   Neutro Abs 8.8 (H) 1.7 - 7.7 K/uL   Lymphocytes Relative 16 %   Lymphs Abs 2.0 0.7 - 4.0 K/uL   Monocytes Relative 10 %   Monocytes Absolute 1.2 (H) 0.1 - 1.0 K/uL   Eosinophils Relative 3 %   Eosinophils Absolute 0.3 0.0 - 0.5 K/uL   Basophils Relative 0 %   Basophils Absolute 0.0 0.0 - 0.1 K/uL   Immature Granulocytes 1 %   Abs Immature Granulocytes 0.07 0.00 - 0.07 K/uL   Dg Chest 2 View  Result Date: 09/24/2018 CLINICAL DATA:  Cough and shortness of breath EXAM: CHEST - 2 VIEW COMPARISON:  None. FINDINGS: Lungs are clear. Heart size and pulmonary vascularity are normal. No adenopathy. No bone lesions. IMPRESSION: No edema or consolidation. Electronically Signed   By: Bretta Bang III M.D.   On: 09/24/2018 09:54    ED Clinical Impression  Cough  Non-intractable vomiting without nausea, unspecified vomiting type   ED Assessment/Plan  Patient coughing and actively throwing up.  While her lungs are clear, the cough seems to be her primary complaint, so we will check a chest x-ray to make sure that she does not have a pneumonia.  She has no evidence of a surgical abdomen or obstruction at this point in time.will check a CMP, lipase, give her Zofran 8, GI cocktail as she states that her "reflux is killing me" to also dampen the cough, 1 liter of fluids, repeat vital signs and reevaluate.  Imaging independently  reviewed.  Normal chest x-ray. Labs reviewed.  Unremarkable except for a slight leukocytosis.  Lipase negative.  Repeat vital signs post IVF- she is no longer tachycardic, her blood pressure is stable.  Patient is tolerating p.o.  1.  Cough.  Most likely from the postnasal drip or acid reflux.  She  does not have any sinus tenderness.  I am hesitant to start her on antibiotics today in the absence of fevers, sinus tenderness.  Discussed this with patient and parent.  They also agreed with this.  Will start on Allegra-D, Flonase, saline nasal irrigation with a Lloyd Huger med rinse and distilled water.  Will start on some Protonix and advised an H2 blocker such as Pepcid for the GERD.  Also some Tessalon and Tussionex for symptomatic relief.  2.  Vomiting.  Zofran.  Liquid diet, advance as tolerated.  To the ER for signs of any dehydration, worsening abdominal pain, fever, or other concerns.  Work note for today for mom, today and tomorrow for patient.  Discussed labs, imaging, MDM, treatment plan, and plan for follow-up with patient Discussed sn/sx that should prompt return to the ED. patient agrees with plan.   Meds ordered this encounter  Medications  . 0.9 %  sodium chloride infusion  . ondansetron (ZOFRAN) injection 8 mg  . gi cocktail (Maalox,Lidocaine,Donnatal)  . ondansetron (ZOFRAN-ODT) 8 MG disintegrating tablet    Sig: Take 1 tablet (8 mg total) by mouth every 8 (eight) hours as needed for nausea or vomiting.    Dispense:  20 tablet    Refill:  0  . benzonatate (TESSALON) 200 MG capsule  Sig: Take 1 capsule (200 mg total) by mouth 3 (three) times daily as needed for cough.    Dispense:  30 capsule    Refill:  0  . chlorpheniramine-HYDROcodone (TUSSIONEX PENNKINETIC ER) 10-8 MG/5ML SUER    Sig: Take 5 mLs by mouth every 12 (twelve) hours as needed for cough.    Dispense:  60 mL    Refill:  0  . fluticasone (FLONASE) 50 MCG/ACT nasal spray    Sig: Place 2 sprays into both nostrils  daily.    Dispense:  16 g    Refill:  0  . pantoprazole (PROTONIX) 20 MG tablet    Sig: Take 1 tablet (20 mg total) by mouth daily.    Dispense:  30 tablet    Refill:  0    *This clinic note was created using Scientist, clinical (histocompatibility and immunogenetics). Therefore, there may be occasional mistakes despite careful proofreading.  ?   Domenick Gong, MD 09/25/18 [id number]

## 2018-12-30 ENCOUNTER — Emergency Department
Admission: EM | Admit: 2018-12-30 | Discharge: 2018-12-30 | Disposition: A | Discharge status: Home / Self Care | Payer: Worker's Compensation | Attending: Emergency Medicine | Admitting: Emergency Medicine

## 2018-12-30 ENCOUNTER — Other Ambulatory Visit: Payer: Self-pay

## 2018-12-30 ENCOUNTER — Emergency Department: Payer: Worker's Compensation

## 2018-12-30 DIAGNOSIS — Y9289 Other specified places as the place of occurrence of the external cause: Secondary | ICD-10-CM | POA: Diagnosis not present

## 2018-12-30 DIAGNOSIS — W208XXA Other cause of strike by thrown, projected or falling object, initial encounter: Secondary | ICD-10-CM | POA: Insufficient documentation

## 2018-12-30 DIAGNOSIS — Y998 Other external cause status: Secondary | ICD-10-CM | POA: Insufficient documentation

## 2018-12-30 DIAGNOSIS — S0093XA Contusion of unspecified part of head, initial encounter: Secondary | ICD-10-CM

## 2018-12-30 DIAGNOSIS — Y9389 Activity, other specified: Secondary | ICD-10-CM | POA: Diagnosis not present

## 2018-12-30 DIAGNOSIS — S0083XA Contusion of other part of head, initial encounter: Secondary | ICD-10-CM | POA: Insufficient documentation

## 2018-12-30 DIAGNOSIS — S0990XA Unspecified injury of head, initial encounter: Secondary | ICD-10-CM | POA: Diagnosis present

## 2018-12-30 LAB — POCT PREGNANCY, URINE: PREG TEST UR: NEGATIVE

## 2018-12-30 NOTE — ED Triage Notes (Signed)
Pt comes via POV from work with c/o head pain. Pt states she was at work and another employee dropped 1/2 sheet of metal pans and hit her in the back of head.  Pt states pain in back of head and neck. Pt denies LOC.  Pt is filing workers comp as well.

## 2018-12-30 NOTE — ED Notes (Signed)
Workers comp completed by [employer]

## 2018-12-30 NOTE — ED Provider Notes (Signed)
[facility] Emergency Department Provider Note  ____________________________________________  Time seen: Approximately 3:23 PM  I have reviewed the triage vital signs and the nursing notes.   HISTORY  Chief Complaint Head Injury    HPI Misty Rowe is a 33 y.o. female presents to the emergency department with occipital headache and neck pain after patient had several metal pans dropped on her head at work.  Patient states that she had momentary loss of vision and vertigo after incident occurred.  No loss of consciousness.  She denies numbness or tingling in the upper or lower extremities.  She has been able to ambulate without difficulty.  Patient has ataxic gait at baseline.  She denies nausea or vomiting.  No other alleviating measures have been attempted.   History reviewed. No pertinent past medical history.  There are no active problems to display for this patient.   Past Surgical History:  Procedure Laterality Date  . NO PAST SURGERIES      Prior to Admission medications   Medication Sig Start Date End Date Taking? Authorizing Provider  benzonatate (TESSALON) 200 MG capsule Take 1 capsule (200 mg total) by mouth 3 (three) times daily as needed for cough. 09/24/18   Domenick Gong, MD  chlorpheniramine-HYDROcodone (TUSSIONEX PENNKINETIC ER) 10-8 MG/5ML SUER Take 5 mLs by mouth every 12 (twelve) hours as needed for cough. 09/24/18   Domenick Gong, MD  fluticasone (FLONASE) 50 MCG/ACT nasal spray Place 2 sprays into both nostrils daily. 09/24/18   Domenick Gong, MD  ondansetron (ZOFRAN-ODT) 8 MG disintegrating tablet Take 1 tablet (8 mg total) by mouth every 8 (eight) hours as needed for nausea or vomiting. 09/24/18   Domenick Gong, MD  pantoprazole (PROTONIX) 20 MG tablet Take 1 tablet (20 mg total) by mouth daily. 09/24/18   Domenick Gong, MD    Allergies Pertussis vaccines  Family History  Problem Relation Age of Onset  .  Asthma Mother   . Hypertension Mother     Social History Social History   Tobacco Use  . Smoking status: Never Smoker  . Smokeless tobacco: Never Used  Substance Use Topics  . Alcohol use: No    Frequency: Never  . Drug use: No     Review of Systems  Constitutional: No fever/chills Eyes: No visual changes. No discharge ENT: No upper respiratory complaints. Cardiovascular: no chest pain. Respiratory: no cough. No SOB. Gastrointestinal: No abdominal pain.  No nausea, no vomiting.  No diarrhea.  No constipation. Genitourinary: Negative for dysuria. No hematuria Musculoskeletal: Patient has neck pain.  Skin: Negative for rash, abrasions, lacerations, ecchymosis. Neurological: Patient has headache, no focal weakness or numbness.   ____________________________________________   PHYSICAL EXAM:  VITAL SIGNS: ED Triage Vitals  Enc Vitals Group     BP 12/30/18 1446 127/76     Pulse Rate 12/30/18 1446 75     Resp 12/30/18 1446 18     Temp 12/30/18 1446 98 F (36.7 C)     Temp src --      SpO2 12/30/18 1446 98 %     Weight 12/30/18 1444 165 lb (74.8 kg)     Height 12/30/18 1444 5\' 4"  (1.626 m)     Head Circumference --      Peak Flow --      Pain Score 12/30/18 1444 6     Pain Loc --      Pain Edu? --      Excl. in GC? --  Constitutional: Alert and oriented. Well appearing and in no acute distress. Eyes: Conjunctivae are normal. PERRL. EOMI. Head: Atraumatic. ENT:      Ears: TMs are pearly.       Nose: No congestion/rhinnorhea.      Mouth/Throat: Mucous membranes are moist.  Neck: No stridor.  No cervical spine tenderness to palpation. Hematological/Lymphatic/Immunilogical: No cervical lymphadenopathy. Cardiovascular: Normal rate, regular rhythm. Normal S1 and S2.  Good peripheral circulation. Respiratory: Normal respiratory effort without tachypnea or retractions. Lungs CTAB. Good air entry to the bases with no decreased or absent breath  sounds. Gastrointestinal: Bowel sounds 4 quadrants. Soft and nontender to palpation. No guarding or rigidity. No palpable masses. No distention. No CVA tenderness. Musculoskeletal: Full range of motion to all extremities. No gross deformities appreciated. Neurologic:  Normal speech and language. No gross focal neurologic deficits are appreciated.  Skin:  Skin is warm, dry and intact. No rash noted. Psychiatric: Mood and affect are normal. Speech and behavior are normal. Patient exhibits appropriate insight and judgement.   ____________________________________________   LABS (all labs ordered are listed, but only abnormal results are displayed)  Labs Reviewed  POCT PREGNANCY, URINE  POC URINE PREG, ED   ____________________________________________  EKG   ____________________________________________  RADIOLOGY I personally viewed and evaluated these images as part of my medical decision making, as well as reviewing the written report by the radiologist.    Ct Head Wo Contrast  Result Date: 12/30/2018 CLINICAL DATA:  Status post blunt trauma with head pain. EXAM: CT HEAD WITHOUT CONTRAST CT CERVICAL SPINE WITHOUT CONTRAST TECHNIQUE: Multidetector CT imaging of the head and cervical spine was performed following the standard protocol without intravenous contrast. Multiplanar CT image reconstructions of the cervical spine were also generated. COMPARISON:  None. FINDINGS: CT HEAD FINDINGS Brain: No evidence of acute infarction, hemorrhage, hydrocephalus, extra-axial collection or mass lesion/mass effect. Vascular: No hyperdense vessel or unexpected calcification. Skull: Normal. Negative for fracture or focal lesion. Sinuses/Orbits: No acute finding. Other: None. CT CERVICAL SPINE FINDINGS Alignment: Reversal of cervical lordosis. Minimal 1 mm anterolisthesis of C2 on C3. Skull base and vertebrae: No acute fracture. No primary bone lesion. 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  Soft tissues and spinal canal: No prevertebral fluid or swelling. No visible canal hematoma. Disc levels: Mild osteoarthritic changes at C3-C4 and C6-C7. Mild posterior facet arthropathy at C2-C3. Upper chest: Negative. Other: None. IMPRESSION: No acute intracranial abnormality. No evidence of acute traumatic injury to the cervical spine. 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 Probably secondary mild osteoarthritic changes at C3-C4 and C6-C7. Mild reversal of cervical lordosis. Electronically Signed   By: Ted Mcalpineobrinka  Dimitrova M.D.   On: 12/30/2018 16:35   Ct Cervical Spine Wo Contrast  Result Date: 12/30/2018 CLINICAL DATA:  Status post blunt trauma with head pain. EXAM: CT HEAD WITHOUT CONTRAST CT CERVICAL SPINE WITHOUT CONTRAST TECHNIQUE: Multidetector CT imaging of the head and cervical spine was performed following the standard protocol without intravenous contrast. Multiplanar CT image reconstructions of the cervical spine were also generated. COMPARISON:  None. FINDINGS: CT HEAD FINDINGS Brain: No evidence of acute infarction, hemorrhage, hydrocephalus, extra-axial collection or mass lesion/mass effect. Vascular: No hyperdense vessel or unexpected calcification. Skull: Normal. Negative for fracture or focal lesion. Sinuses/Orbits: No acute finding. Other: None. CT CERVICAL SPINE FINDINGS Alignment: Reversal of cervical lordosis. Minimal 1 mm anterolisthesis of C2 on C3. Skull base and  vertebrae: No acute fracture. No primary bone lesion. 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 Soft tissues and spinal canal: No prevertebral fluid or swelling. No visible canal hematoma. Disc levels: Mild osteoarthritic changes at C3-C4 and C6-C7. Mild posterior facet arthropathy at C2-C3. Upper chest: Negative. Other: None. IMPRESSION: No acute intracranial abnormality. No evidence of acute traumatic injury to the cervical  spine. 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 Probably secondary mild osteoarthritic changes at C3-C4 and C6-C7. Mild reversal of cervical lordosis. Electronically Signed   By: Ted Mcalpineobrinka  Dimitrova M.D.   On: 12/30/2018 16:35    ____________________________________________    PROCEDURES  Procedure(s) performed:    Procedures    Medications - No data to display   ____________________________________________   INITIAL IMPRESSION / ASSESSMENT AND PLAN / ED COURSE  Pertinent labs & imaging results that were available during my care of the patient were reviewed by me and considered in my medical decision making (see chart for details).  Review of the [place] CSRS was performed in accordance of the [facility] prior to dispensing any controlled drugs.      Assessment and Plan:  Head Contusion Patient presents to the emergency department with headache and neck pain after patient had a metal pan dropped on her head at work.  Patient complained of momentary loss of vision and vertigo after incident occurred.  Neurologic exam was reassuring.  CT head and cervical spine revealed no acute abnormality.  Patient declined medications at discharge stating that she preferred to "power sleep".  Strict return precautions were given to return for new or worsening symptoms.  All patient questions were answered.  ____________________________________________  FINAL CLINICAL IMPRESSION(S) / ED DIAGNOSES  Final diagnoses:  Contusion of head, unspecified part of head, initial encounter      NEW MEDICATIONS STARTED DURING THIS VISIT:  ED Discharge Orders    None          This chart was dictated using voice recognition software/Dragon. Despite best efforts to proofread, errors can occur which can change the meaning. Any change was purely unintentional.    Orvil FeilWoods, Fuad Forget M, PA-C 12/30/18 1801     Minna AntisPaduchowski, Kevin, MD 12/30/18 2306

## 2018-12-30 NOTE — ED Notes (Signed)
See triage note  Presents with head /neck pain  States she had some sheet pans drop on her  Having pain to right back of head and into neck  States she had some blurred vision for a few secs.

## 2020-04-04 ENCOUNTER — Ambulatory Visit
Admission: EM | Admit: 2020-04-04 | Discharge: 2020-04-04 | Disposition: A | Discharge status: Home / Self Care | Payer: 59 | Attending: Family Medicine | Admitting: Family Medicine

## 2020-04-04 ENCOUNTER — Encounter: Payer: Self-pay | Admitting: Emergency Medicine

## 2020-04-04 ENCOUNTER — Other Ambulatory Visit: Payer: Self-pay

## 2020-04-04 DIAGNOSIS — Z79899 Other long term (current) drug therapy: Secondary | ICD-10-CM | POA: Insufficient documentation

## 2020-04-04 DIAGNOSIS — Z887 Allergy status to serum and vaccine status: Secondary | ICD-10-CM | POA: Diagnosis not present

## 2020-04-04 DIAGNOSIS — R05 Cough: Secondary | ICD-10-CM | POA: Diagnosis present

## 2020-04-04 DIAGNOSIS — F419 Anxiety disorder, unspecified: Secondary | ICD-10-CM | POA: Insufficient documentation

## 2020-04-04 DIAGNOSIS — Z888 Allergy status to other drugs, medicaments and biological substances status: Secondary | ICD-10-CM | POA: Diagnosis not present

## 2020-04-04 DIAGNOSIS — Z20822 Contact with and (suspected) exposure to covid-19: Secondary | ICD-10-CM | POA: Insufficient documentation

## 2020-04-04 DIAGNOSIS — Z8249 Family history of ischemic heart disease and other diseases of the circulatory system: Secondary | ICD-10-CM | POA: Insufficient documentation

## 2020-04-04 DIAGNOSIS — Z825 Family history of asthma and other chronic lower respiratory diseases: Secondary | ICD-10-CM | POA: Insufficient documentation

## 2020-04-04 DIAGNOSIS — J069 Acute upper respiratory infection, unspecified: Secondary | ICD-10-CM | POA: Insufficient documentation

## 2020-04-04 HISTORY — DX: Anxiety disorder, unspecified: F41.9

## 2020-04-04 MED ORDER — BENZONATATE 200 MG PO CAPS: ORAL_CAPSULE | ORAL | 0 refills | Status: AC

## 2020-04-04 NOTE — ED Provider Notes (Signed)
[facility]    CSN: [id number] Arrival date & time: 04/04/20  1413      History   Chief Complaint Chief Complaint  Patient presents with  . Cough    HPI Jacquelin Krajewski is a 34 y.o. female.   HPI  34 year old female presents complaining of a cough for a week.  She states that today the cough is much worse with the production of yellow mucus.  She does describe shortness of breath and chest tightness particularly with the coughing.  Denies any fever or chills.  She is a non-smoker.  She had a history of asthma as a child but has not any attack had any attacks in adulthood.  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  Denies any wheezing or stridor.        Past Medical History:  Diagnosis Date  . Anxiety     There are no problems to display for this patient.   Past Surgical History:  Procedure Laterality Date  . NO PAST SURGERIES      OB History   No obstetric history on file.      Home Medications    Prior to Admission medications   Medication Sig Start Date End Date Taking? Authorizing Provider  FLUoxetine (PROZAC) 20 MG capsule Take 20 mg by mouth daily. 01/28/20  Yes [provider]  benzonatate (TESSALON) 200 MG capsule Take one cap TID PRN cough 04/04/20   Lutricia Feil, PA-C  fluticasone [facility]) 50 MCG/ACT nasal spray Place 2 sprays into both nostrils daily. 09/24/18 04/04/20  Domenick Gong, MD  pantoprazole (PROTONIX) 20 MG tablet Take 1 tablet (20 mg total) by mouth daily. 09/24/18 04/04/20  Domenick Gong, MD    Family History Family History  Problem Relation Age of Onset  . Asthma Mother   . Hypertension Mother     Social History Social History   Tobacco Use  . Smoking status: Never Smoker  . Smokeless tobacco: Never Used  Substance Use Topics  . Alcohol use: No  . Drug use: No     Allergies   Pertussis vaccines and Venlafaxine   Review of Systems Review of  Systems  Constitutional: Positive for activity change. Negative for appetite change, chills, diaphoresis, fatigue and fever.  Respiratory: Positive for cough, chest tightness and shortness of breath. Negative for wheezing and stridor.   All other systems reviewed and are negative.    Physical Exam Triage Vital Signs ED Triage Vitals  Enc Vitals Group     BP 04/04/20 1434 123/84     Pulse Rate 04/04/20 1434 96     Resp 04/04/20 1434 14     Temp 04/04/20 1434 98.5 F (36.9 C)     Temp Source 04/04/20 1434 Oral     SpO2 04/04/20 1434 100 %     Weight 04/04/20 1430 165 lb (74.8 kg)     Height 04/04/20 1430 5\' 4"  (1.626 m)     Head Circumference --      Peak Flow --      Pain Score 04/04/20 1429 5     Pain Loc --      Pain Edu? --      Excl. in GC? --    No data found.  Updated Vital Signs BP 123/84 (BP Location: Left Arm)   Pulse 96   Temp 98.5 F (36.9 C) (Oral)   Resp 14   Ht 5\' 4"  (  1.626 m)   Wt 165 lb (74.8 kg)   SpO2 100%   BMI 28.32 kg/m   Visual Acuity Right Eye Distance:   Left Eye Distance:   Bilateral Distance:    Right Eye Near:   Left Eye Near:    Bilateral Near:     Physical Exam Vitals and nursing note reviewed.  Constitutional:      General: She is not in acute distress.    Appearance: Normal appearance. She is not ill-appearing or toxic-appearing.  HENT:     Head: Normocephalic and atraumatic.  Eyes:     Conjunctiva/sclera: Conjunctivae normal.  Cardiovascular:     Rate and Rhythm: Normal rate and regular rhythm.     Heart sounds: Normal heart sounds.  Pulmonary:     Effort: Pulmonary effort is normal.     Breath sounds: Normal breath sounds.  Musculoskeletal:        General: Normal range of motion.     Cervical back: Normal range of motion and neck supple.  Skin:    General: Skin is warm and dry.  Neurological:     General: No focal deficit present.     Mental Status: She is alert and oriented to person, place, and time.   Psychiatric:        Mood and Affect: Mood normal.        Behavior: Behavior normal.        Thought Content: Thought content normal.        Judgment: Judgment normal.      UC Treatments / Results  Labs (all labs ordered are listed, but only abnormal results are displayed) Labs Reviewed  SARS CORONAVIRUS 2 (TAT 6-24 HRS)    EKG   Radiology No results found.  Procedures Procedures (including critical care time)  Medications Ordered in UC Medications - No data to display  Initial Impression / Assessment and Plan / UC Course  I have reviewed the triage vital signs and the nursing notes.  Pertinent labs & imaging results that were available during my care of the patient were reviewed by me and considered in my medical decision making (see chart for details).   34 year old female presents with a cough that she has had for a week.  Is become more productive recently.  She also describes shortness of breath and chest congestion per tickly when the cough is severe.  She has had no fever or chills.  Physical exam is unremarkable.  She has no rales rhonchi or wheezing.  She does not appear ill.  She is afebrile today O2 sats are 100% on room air.  Was tested for Covid.  Results will be available 12 to 24 hours.  Quarantined her for that the length of time necessary for results of the Covid testing.  I provided her with Jerilynn Som.  If she is negative for Covid she may return to work.  She may require different jobs in [employer] instead of serving because of the coughing.  That is at the discretion of the [place].  Is not improving she may return to our clinic or follow-up with her primary care physician.   Final Clinical Impressions(s) / UC Diagnoses   Final diagnoses:  Upper respiratory tract infection, unspecified type     Discharge Instructions     Use Flonase nasal spray 2 sprays each nostril daily for the next 2 weeks.  Use Tessalon Perles as necessary for cough.     ED Prescriptions  Medication Sig Dispense Auth. Provider   benzonatate (TESSALON) 200 MG capsule Take one cap TID PRN cough 30 capsule Lorin Picket, PA-C     PDMP not reviewed this encounter.   Lorin Picket, PA-C 04/04/20 1502

## 2020-04-04 NOTE — Discharge Instructions (Addendum)
Use Flonase nasal spray 2 sprays each nostril daily for the next 2 weeks.  Use Tessalon Perles as necessary for cough.

## 2020-04-04 NOTE — ED Triage Notes (Signed)
Patient c/o cough for a week.  Patient states that this morning she coughed up yellow mucous.  Patient denies fevers.

## 2020-04-05 LAB — SARS CORONAVIRUS 2 (TAT 6-24 HRS): SARS Coronavirus 2: NEGATIVE
